# Patient Record
Sex: Male | Born: 2000 | Race: Black or African American | Hispanic: No | Marital: Single | Smoking: Current some day smoker
Health system: Southern US, Community
[De-identification: ages and names within clinical notes are randomized; demographics above are authoritative.]

---
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
## 2020-02-16 DIAGNOSIS — Z20822 Contact with and (suspected) exposure to covid-19: Secondary | ICD-10-CM | POA: Diagnosis not present

## 2020-06-20 DIAGNOSIS — Z20822 Contact with and (suspected) exposure to covid-19: Secondary | ICD-10-CM | POA: Diagnosis not present

## 2020-06-20 DIAGNOSIS — B2790 Infectious mononucleosis, unspecified without complication: Secondary | ICD-10-CM | POA: Diagnosis not present

## 2020-06-20 DIAGNOSIS — J029 Acute pharyngitis, unspecified: Secondary | ICD-10-CM | POA: Diagnosis not present

## 2020-12-16 DIAGNOSIS — Z1331 Encounter for screening for depression: Secondary | ICD-10-CM | POA: Diagnosis not present

## 2020-12-16 DIAGNOSIS — Z0000 Encounter for general adult medical examination without abnormal findings: Secondary | ICD-10-CM | POA: Diagnosis not present

## 2020-12-16 DIAGNOSIS — Z1339 Encounter for screening examination for other mental health and behavioral disorders: Secondary | ICD-10-CM | POA: Diagnosis not present

## 2021-03-25 DIAGNOSIS — Z713 Dietary counseling and surveillance: Secondary | ICD-10-CM | POA: Diagnosis not present

## 2021-04-22 DIAGNOSIS — Z713 Dietary counseling and surveillance: Secondary | ICD-10-CM | POA: Diagnosis not present

## 2021-05-28 DIAGNOSIS — Z713 Dietary counseling and surveillance: Secondary | ICD-10-CM | POA: Diagnosis not present

## 2021-06-27 DIAGNOSIS — Z713 Dietary counseling and surveillance: Secondary | ICD-10-CM | POA: Diagnosis not present

## 2021-11-27 DIAGNOSIS — R5383 Other fatigue: Secondary | ICD-10-CM | POA: Diagnosis not present

## 2021-11-27 DIAGNOSIS — Z0000 Encounter for general adult medical examination without abnormal findings: Secondary | ICD-10-CM | POA: Diagnosis not present

## 2021-11-27 DIAGNOSIS — Z6834 Body mass index (BMI) 34.0-34.9, adult: Secondary | ICD-10-CM | POA: Diagnosis not present

## 2021-11-27 DIAGNOSIS — Z131 Encounter for screening for diabetes mellitus: Secondary | ICD-10-CM | POA: Diagnosis not present

## 2021-11-27 DIAGNOSIS — Z1339 Encounter for screening examination for other mental health and behavioral disorders: Secondary | ICD-10-CM | POA: Diagnosis not present

## 2021-11-27 DIAGNOSIS — R030 Elevated blood-pressure reading, without diagnosis of hypertension: Secondary | ICD-10-CM | POA: Diagnosis not present

## 2021-11-27 DIAGNOSIS — Z1331 Encounter for screening for depression: Secondary | ICD-10-CM | POA: Diagnosis not present

## 2022-01-04 ENCOUNTER — Telehealth: Payer: BC Managed Care – PPO | Admitting: Nurse Practitioner

## 2022-01-04 DIAGNOSIS — J069 Acute upper respiratory infection, unspecified: Secondary | ICD-10-CM | POA: Diagnosis not present

## 2022-01-04 MED ORDER — PSEUDOEPH-BROMPHEN-DM 30-2-10 MG/5ML PO SYRP: 5.0000 mL | ORAL_SOLUTION | Freq: Four times a day (QID) | ORAL | 0 refills | Status: DC | PRN

## 2022-01-04 NOTE — Patient Instructions (Signed)
  John Macdonald, thank you for joining [name], NP for today's virtual visit.  While this provider is not your primary care provider (PCP), if your PCP is located in our provider database this encounter information will be shared with them immediately following your visit.   A [facility] MyChart account gives you access to today's visit and all your visits, tests, and labs performed at [facility] " click here if you don't have a [name] account or go to [email or web address]  Consent: (Patient) John Macdonald provided verbal consent for this virtual visit at the beginning of the encounter.  Current Medications:  Current Outpatient Medications:    brompheniramine-pseudoephedrine-DM 30-2-10 MG/5ML syrup, Take 5 mLs by mouth 4 (four) times daily as needed., Disp: 240 mL, Rfl: 0   Bisacodyl (LAXATIVE PO), Take 1 tablet by mouth daily as needed (constipation)., Disp: , Rfl:    bismuth subsalicylate (PEPTO BISMOL) 262 MG chewable tablet, Chew 524 mg by mouth daily as needed for indigestion or diarrhea or loose stools., Disp: , Rfl:    ibuprofen (ADVIL,MOTRIN) 200 MG tablet, Take 200 mg by mouth every 4 (four) hours as needed for moderate pain., Disp: , Rfl:    naproxen sodium (ANAPROX) 220 MG tablet, Take 220 mg by mouth 2 (two) times daily as needed (pain)., Disp: , Rfl:    Medications ordered in this encounter:  Meds ordered this encounter  Medications   brompheniramine-pseudoephedrine-DM 30-2-10 MG/5ML syrup    Sig: Take 5 mLs by mouth 4 (four) times daily as needed.    Dispense:  240 mL    Refill:  0    Order Specific Question:   Supervising Provider    Answer:   Merrilee Jansky [[id number]]     *If you need refills on other medications prior to your next appointment, please contact your pharmacy*  Follow-Up: Call back or seek an in-person evaluation if the symptoms worsen or if the condition fails to improve as anticipated.  [facility]  [phone]  Other Instructions INSTRUCTIONS: use a humidifier for nasal congestion Drink plenty of fluids, rest and wash hands frequently to avoid the spread of infection Alternate tylenol and Motrin for relief of fever    If you have been instructed to have an in-person evaluation today at a local Urgent Care facility, please use the link below. It will take you to a list of all of our available [facility], including address, phone number and hours of operation. Please do not delay care.  [facility]  If you or a family member do not have a primary care provider, use the link below to schedule a visit and establish care. When you choose a [facility] primary care physician or advanced practice provider, you gain a long-term partner in health. Find a Primary Care Provider  Learn more about [facility] in-office and virtual care options: [facility] - Get Care Now

## 2022-01-04 NOTE — Progress Notes (Signed)
Virtual Visit Consent   John Macdonald, you are scheduled for a virtual visit with a [facility] provider today. Just as with appointments in the office, your consent must be obtained to participate. Your consent will be active for this visit and any virtual visit you may have with one of our providers in the next 365 days. If you have a MyChart account, a copy of this consent can be sent to you electronically.  As this is a virtual visit, video technology does not allow for your provider to perform a traditional examination. This may limit your provider's ability to fully assess your condition. If your provider identifies any concerns that need to be evaluated in person or the need to arrange testing (such as labs, EKG, etc.), we will make arrangements to do so. Although advances in technology are sophisticated, we cannot ensure that it will always work on either your end or our end. If the connection with a video visit is poor, the visit may have to be switched to a telephone visit. With either a video or telephone visit, we are not always able to ensure that we have a secure connection.  By engaging in this virtual visit, you consent to the provision of healthcare and authorize for your insurance to be billed (if applicable) for the services provided during this visit. Depending on your insurance coverage, you may receive a charge related to this service.  I need to obtain your verbal consent now. Are you willing to proceed with your visit today? [name] has provided verbal consent on 01/04/2022 for a virtual visit (video or telephone). [name], NP  Date: 01/04/2022 3:20 PM  Virtual Visit via Video Note   I, [name], connected with  John Macdonald  ([id number], 10/13/2000) on 01/04/22 at  3:00 PM EST by a video-enabled telemedicine application and verified that I am speaking with the correct person using two identifiers.  Location: Patient: Virtual Visit Location Patient:  Home Provider: Virtual Visit Location Provider: Home Office   I discussed the limitations of evaluation and management by telemedicine and the availability of in person appointments. The patient expressed understanding and agreed to proceed.    History of Present Illness: John Macdonald is a 21 y.o. who identifies as a male who was assigned male at birth, and is being seen today for URI.  Patient complains of symptoms of a URI. Symptoms include congestion, cough, fever, and fatigue . Onset of symptoms was 1 day ago, unchanged since that time. Evaluation to date: none. Treatment to date: none.   Problems: There are no problems to display for this patient.   Allergies:  Allergies  Allergen Reactions   Pollen Extract     Itching and watery eyes.   Medications:  Current Outpatient Medications:    brompheniramine-pseudoephedrine-DM 30-2-10 MG/5ML syrup, Take 5 mLs by mouth 4 (four) times daily as needed., Disp: 240 mL, Rfl: 0   Bisacodyl (LAXATIVE PO), Take 1 tablet by mouth daily as needed (constipation)., Disp: , Rfl:    bismuth subsalicylate (PEPTO BISMOL) 262 MG chewable tablet, Chew 524 mg by mouth daily as needed for indigestion or diarrhea or loose stools., Disp: , Rfl:    ibuprofen (ADVIL,MOTRIN) 200 MG tablet, Take 200 mg by mouth every 4 (four) hours as needed for moderate pain., Disp: , Rfl:    naproxen sodium (ANAPROX) 220 MG tablet, Take 220 mg by mouth 2 (two) times daily as needed (pain)., Disp: , Rfl:   Observations/Objective:  Patient is well-developed, well-nourished in no acute distress.  Resting comfortably  at home.  Head is normocephalic, atraumatic.  No labored breathing.  Speech is clear and coherent with logical content.  Patient is alert and oriented at baseline.    Assessment and Plan: 1. URI, acute - brompheniramine-pseudoephedrine-DM 30-2-10 MG/5ML syrup; Take 5 mLs by mouth 4 (four) times daily as needed.  Dispense: 240 mL; Refill: 0  INSTRUCTIONS: use a  humidifier for nasal congestion Drink plenty of fluids, rest and wash hands frequently to avoid the spread of infection Alternate tylenol and Motrin for relief of fever   Follow Up Instructions: I discussed the assessment and treatment plan with the patient. The patient was provided an opportunity to ask questions and all were answered. The patient agreed with the plan and demonstrated an understanding of the instructions.  A copy of instructions were sent to the patient via MyChart unless otherwise noted below.   The patient was advised to call back or seek an in-person evaluation if the symptoms worsen or if the condition fails to improve as anticipated.  Time:  I spent 11 minutes with the patient via telehealth technology discussing the above problems/concerns.    [name], NP

## 2022-11-04 ENCOUNTER — Ambulatory Visit: Payer: BC Managed Care – PPO | Admitting: Nurse Practitioner

## 2022-11-04 ENCOUNTER — Encounter: Payer: Self-pay | Admitting: Nurse Practitioner

## 2022-11-04 VITALS — BP 120/80 | HR 62 | Temp 98.7°F | Ht 65.0 in | Wt 209.0 lb

## 2022-11-04 DIAGNOSIS — Z23 Encounter for immunization: Secondary | ICD-10-CM

## 2022-11-04 DIAGNOSIS — Z1159 Encounter for screening for other viral diseases: Secondary | ICD-10-CM | POA: Diagnosis not present

## 2022-11-04 DIAGNOSIS — F32A Depression, unspecified: Secondary | ICD-10-CM

## 2022-11-04 DIAGNOSIS — Z833 Family history of diabetes mellitus: Secondary | ICD-10-CM

## 2022-11-04 DIAGNOSIS — Z114 Encounter for screening for human immunodeficiency virus [HIV]: Secondary | ICD-10-CM | POA: Diagnosis not present

## 2022-11-04 DIAGNOSIS — Z7689 Persons encountering health services in other specified circumstances: Secondary | ICD-10-CM

## 2022-11-04 DIAGNOSIS — Z113 Encounter for screening for infections with a predominantly sexual mode of transmission: Secondary | ICD-10-CM

## 2022-11-04 DIAGNOSIS — E6609 Other obesity due to excess calories: Secondary | ICD-10-CM | POA: Diagnosis not present

## 2022-11-04 DIAGNOSIS — Z6834 Body mass index (BMI) 34.0-34.9, adult: Secondary | ICD-10-CM

## 2022-11-04 DIAGNOSIS — M25531 Pain in right wrist: Secondary | ICD-10-CM | POA: Diagnosis not present

## 2022-11-04 DIAGNOSIS — F419 Anxiety disorder, unspecified: Secondary | ICD-10-CM

## 2022-11-05 LAB — HEMOGLOBIN A1C
Est. average glucose Bld gHb Est-mCnc: 111 mg/dL
Hgb A1c MFr Bld: 5.5 % (ref 4.8–5.6)

## 2022-11-05 LAB — HEPATITIS B SURFACE ANTIBODY,QUALITATIVE: Hep B Surface Ab, Qual: NONREACTIVE

## 2022-11-05 LAB — HEPATITIS C ANTIBODY: Hep C Virus Ab: NONREACTIVE

## 2022-11-05 LAB — RPR: RPR Ser Ql: NONREACTIVE

## 2022-11-05 LAB — HIV ANTIBODY (ROUTINE TESTING W REFLEX): HIV Screen 4th Generation wRfx: NONREACTIVE

## 2022-11-05 LAB — HSV-2 AB, IGG: HSV 2 IgG, Type Spec: <0.91 index (ref 0.00–0.90)

## 2022-11-05 LAB — TSH: TSH: 1.05 u[IU]/mL (ref 0.450–4.500)

## 2022-11-07 LAB — CHLAMYDIA/GONOCOCCUS/TRICHOMONAS, NAA
Chlamydia by NAA: NEGATIVE
Gonococcus by NAA: NEGATIVE
Trich vag by NAA: NEGATIVE

## 2022-11-23 ENCOUNTER — Encounter: Payer: Self-pay | Admitting: Nurse Practitioner

## 2022-11-23 DIAGNOSIS — Z23 Encounter for immunization: Secondary | ICD-10-CM | POA: Insufficient documentation

## 2022-11-23 DIAGNOSIS — F419 Anxiety disorder, unspecified: Secondary | ICD-10-CM | POA: Insufficient documentation

## 2022-11-23 DIAGNOSIS — Z1159 Encounter for screening for other viral diseases: Secondary | ICD-10-CM | POA: Insufficient documentation

## 2022-11-23 DIAGNOSIS — M25531 Pain in right wrist: Secondary | ICD-10-CM | POA: Insufficient documentation

## 2022-11-23 DIAGNOSIS — E66811 Obesity, class 1: Secondary | ICD-10-CM | POA: Insufficient documentation

## 2022-11-23 DIAGNOSIS — Z7689 Persons encountering health services in other specified circumstances: Secondary | ICD-10-CM | POA: Insufficient documentation

## 2022-11-23 NOTE — Assessment & Plan Note (Signed)
Depression screen score is 13 and anxiety is 8, he does not want to harm himself states "If I were to leave this world I would be okay", will refer to psychiatrist to evaluate for any mental health or behavioral health problems.

## 2022-11-23 NOTE — Assessment & Plan Note (Signed)
Influenza vaccine administered Encouraged to take Tylenol as needed for fever or muscle aches.

## 2022-11-23 NOTE — Assessment & Plan Note (Signed)
No abnormal findings on physical exam, continue to monitor if this worsens return call to office may need an xray or referral to orthopedic/hand specialist

## 2022-11-23 NOTE — Assessment & Plan Note (Signed)

## 2022-11-23 NOTE — Assessment & Plan Note (Signed)
Will check Hepatitis C screening due to recent recommendations to screen all adults 18 years and older

## 2022-11-23 NOTE — Assessment & Plan Note (Signed)
She is encouraged to strive for BMI less than 30 to decrease cardiac risk. Advised to aim for at least 150 minutes of exercise per week. Will check labs pending results will consider other options, may refer to healthy weight and wellness.

## 2022-11-23 NOTE — Assessment & Plan Note (Signed)
Tdap given in office

## 2022-12-30 DIAGNOSIS — R5383 Other fatigue: Secondary | ICD-10-CM | POA: Diagnosis not present

## 2022-12-30 DIAGNOSIS — Z1339 Encounter for screening examination for other mental health and behavioral disorders: Secondary | ICD-10-CM | POA: Diagnosis not present

## 2022-12-30 DIAGNOSIS — Z0000 Encounter for general adult medical examination without abnormal findings: Secondary | ICD-10-CM | POA: Diagnosis not present

## 2022-12-30 DIAGNOSIS — R030 Elevated blood-pressure reading, without diagnosis of hypertension: Secondary | ICD-10-CM | POA: Diagnosis not present

## 2022-12-30 DIAGNOSIS — Z13220 Encounter for screening for lipoid disorders: Secondary | ICD-10-CM | POA: Diagnosis not present

## 2022-12-30 DIAGNOSIS — Z6834 Body mass index (BMI) 34.0-34.9, adult: Secondary | ICD-10-CM | POA: Diagnosis not present

## 2022-12-30 DIAGNOSIS — E6609 Other obesity due to excess calories: Secondary | ICD-10-CM | POA: Diagnosis not present

## 2022-12-30 DIAGNOSIS — Z131 Encounter for screening for diabetes mellitus: Secondary | ICD-10-CM | POA: Diagnosis not present
